# Patient Record
Sex: Male | Born: 2000 | Race: White | Hispanic: No | Marital: Single | Smoking: Never smoker
Health system: Southern US, Community
[De-identification: ages and names within clinical notes are randomized; demographics above are authoritative.]

## PROBLEM LIST (undated history)

## (undated) DIAGNOSIS — S52502D Unspecified fracture of the lower end of left radius, subsequent encounter for closed fracture with routine healing: Secondary | ICD-10-CM

## (undated) DIAGNOSIS — L709 Acne, unspecified: Secondary | ICD-10-CM

## (undated) HISTORY — PX: CIRCUMCISION: SUR203

## (undated) HISTORY — DX: Acne, unspecified: L70.9

## (undated) HISTORY — DX: Unspecified fracture of the lower end of left radius, subsequent encounter for closed fracture with routine healing: S52.502D

---
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
## 2022-10-28 ENCOUNTER — Encounter: Payer: Self-pay | Admitting: Nurse Practitioner

## 2022-10-28 ENCOUNTER — Ambulatory Visit: Payer: BC Managed Care – PPO | Admitting: Nurse Practitioner

## 2022-10-28 ENCOUNTER — Other Ambulatory Visit (HOSPITAL_COMMUNITY)
Admission: RE | Admit: 2022-10-28 | Discharge: 2022-10-28 | Disposition: A | Discharge status: Home / Self Care | Payer: BC Managed Care – PPO | Source: Ambulatory Visit | Attending: Nurse Practitioner | Admitting: Nurse Practitioner

## 2022-10-28 ENCOUNTER — Other Ambulatory Visit: Payer: Self-pay

## 2022-10-28 VITALS — BP 120/80 | HR 85 | Temp 98.4°F | Resp 18 | Ht 69.25 in | Wt 153.0 lb

## 2022-10-28 DIAGNOSIS — Z113 Encounter for screening for infections with a predominantly sexual mode of transmission: Secondary | ICD-10-CM | POA: Diagnosis present

## 2022-10-28 DIAGNOSIS — T38895A Adverse effect of other hormones and synthetic substitutes, initial encounter: Secondary | ICD-10-CM

## 2022-10-28 DIAGNOSIS — Z23 Encounter for immunization: Secondary | ICD-10-CM | POA: Diagnosis not present

## 2022-10-28 DIAGNOSIS — R58 Hemorrhage, not elsewhere classified: Secondary | ICD-10-CM | POA: Diagnosis not present

## 2022-10-28 DIAGNOSIS — Z1159 Encounter for screening for other viral diseases: Secondary | ICD-10-CM

## 2022-10-28 DIAGNOSIS — Z114 Encounter for screening for human immunodeficiency virus [HIV]: Secondary | ICD-10-CM | POA: Diagnosis not present

## 2022-10-28 DIAGNOSIS — K640 First degree hemorrhoids: Secondary | ICD-10-CM | POA: Diagnosis not present

## 2022-10-28 DIAGNOSIS — Z131 Encounter for screening for diabetes mellitus: Secondary | ICD-10-CM

## 2022-10-28 DIAGNOSIS — Z130 Encounter for screening for diseases of the blood and blood-forming organs and certain disorders involving the immune mechanism: Secondary | ICD-10-CM

## 2022-10-28 DIAGNOSIS — Z8379 Family history of other diseases of the digestive system: Secondary | ICD-10-CM

## 2022-10-28 DIAGNOSIS — Z13220 Encounter for screening for lipoid disorders: Secondary | ICD-10-CM

## 2022-10-28 MED ORDER — HYDROCORTISONE ACETATE 25 MG RE SUPP: 25.0000 mg | Freq: Two times a day (BID) | RECTAL | 0 refills | Status: DC

## 2022-10-28 NOTE — Progress Notes (Signed)
BP 120/80   Pulse 85   Temp 98.4 F (36.9 C) (Oral)   Resp 18   Ht 5' 9.25" (1.759 m)   Wt 153 lb (69.4 kg)   SpO2 99%   BMI 22.43 kg/m    Subjective:    Patient ID: Kevin Miranda, male    DOB: Aug 07, 2001, 22 y.o.   MRN: [id number]  HPI: Kevin Miranda is a 22 y.o. male  Chief Complaint  Patient presents with   Establish Care   hormone  Establish care: his last physical was years ago.  Medical history includes was on hormone therapy from planned parent hood but is no longer taking.  Family history includes MI, depression, celiac disease.  Health Maintenance labs.   Blood on his toilet paper:  he reports that he has had blood on his toilet paper off and on for at least a year.  He says he notices the blood on his toilet paper.  He denies any constipation.  He says it sometimes hurts when he has a bowel movement.  Upon inspection noted 1 degree hemorrhoid.  Discussed findings with patient, sent in Anusol suppository. Recommend increasing fiber and water in diet.  Can also take a stool softener.  If no improvement will refer to GI.  Family history of celiac disease: his mother was recently diagnosed with celiac disease and he would like to be checked.   Hormones checked:  he says he would like to have his hormones checked.  He was previously taking estrogen and is no longer taking it.  He says he was transitioning but is no longer and stopped taking the hormones.    Relevant past medical, surgical, family and social history reviewed and updated as indicated. Interim medical history since our last visit reviewed. Allergies and medications reviewed and updated.  Review of Systems  Constitutional: Negative for fever or weight change.  Respiratory: Negative for cough and shortness of breath.   Cardiovascular: Negative for chest pain or palpitations.  Gastrointestinal: Negative for abdominal pain, no bowel changes.  Musculoskeletal: Negative for gait problem or joint swelling.   Skin: Negative for rash.  Neurological: Negative for dizziness or headache.  No other specific complaints in a complete review of systems (except as listed in HPI above).      Objective:    BP 120/80   Pulse 85   Temp 98.4 F (36.9 C) (Oral)   Resp 18   Ht 5' 9.25" (1.759 m)   Wt 153 lb (69.4 kg)   SpO2 99%   BMI 22.43 kg/m   Wt Readings from Last 3 Encounters:  10/28/22 153 lb (69.4 kg)  04/03/17 138 lb (62.6 kg) (41 %, Z= -0.22)*  02/08/16 136 lb 8 oz (61.9 kg) (54 %, Z= 0.11)*   * Growth percentiles are based on CDC (Boys, 2-20 Years) data.    Physical Exam  Constitutional: Patient appears well-developed and well-nourished.  No distress.  HEENT: head atraumatic, normocephalic, pupils equal and reactive to light, neck supple Cardiovascular: Normal rate, regular rhythm and normal heart sounds.  No murmur heard. No BLE edema. Pulmonary/Chest: Effort normal and breath sounds normal. No respiratory distress. Abdominal: Soft.  There is no tenderness. Anus:  1 degree hemorrhoid  Psychiatric: Patient has a normal mood and affect. behavior is normal. Judgment and thought content normal.     Assessment & Plan:   Problem List Items Addressed This Visit   None Visit Diagnoses     Grade I  hemorrhoids    -  Primary   Anusol suppository. Recommend increasing fiber and water in diet.  Can also take a stool softener.  If no improvement will refer to GI.   Relevant Medications   hydrocortisone (ANUSOL-HC) 25 MG suppository   Blood on toilet paper       homorrhoid noted on exam   Relevant Orders   CBC with Differential/Platelet   Need for influenza vaccination       Relevant Orders   Flu Vaccine QUAD 6+ mos PF IM (Fluarix Quad PF) (Completed)   Encounter for hepatitis C screening test for low risk patient       Relevant Orders   Hepatitis C antibody   Screening for HIV without presence of risk factors       Relevant Orders   HIV Antibody (routine testing w rflx)   Screening  for cholesterol level       Relevant Orders   Lipid panel   Screening for diabetes mellitus       Relevant Orders   COMPLETE METABOLIC PANEL WITH GFR   Hemoglobin A1c   Screening for deficiency anemia       Relevant Orders   CBC with Differential/Platelet   Family history of celiac disease       Relevant Orders   Celiac Disease Panel   Adverse effect of sex hormones       patient was previously on estrogen for transitioning,  he is no longer taking it and would like to check his hormone levels.   Relevant Orders   Estrogens, total   Testosterone   TSH   Screening for STD (sexually transmitted disease)       Relevant Orders   Urine cytology ancillary only   RPR        Follow up plan: Return in about 6 months (around 04/28/2023) for cpe.

## 2022-10-30 LAB — URINE CYTOLOGY ANCILLARY ONLY
Chlamydia: NEGATIVE
Comment: NEGATIVE
Comment: NORMAL
Neisseria Gonorrhea: NEGATIVE

## 2022-10-31 LAB — RPR: RPR Ser Ql: NONREACTIVE

## 2022-10-31 LAB — COMPLETE METABOLIC PANEL WITH GFR
AG Ratio: 1.8 (calc) (ref 1.0–2.5)
ALT: 12 U/L (ref 9–46)
AST: 15 U/L (ref 10–40)
Albumin: 4.8 g/dL (ref 3.6–5.1)
Alkaline phosphatase (APISO): 88 U/L (ref 36–130)
BUN: 17 mg/dL (ref 7–25)
CO2: 29 mmol/L (ref 20–32)
Calcium: 9.6 mg/dL (ref 8.6–10.3)
Chloride: 104 mmol/L (ref 98–110)
Creat: 0.81 mg/dL (ref 0.60–1.24)
Globulin: 2.6 g/dL (calc) (ref 1.9–3.7)
Glucose, Bld: 104 mg/dL — ABNORMAL HIGH (ref 65–99)
Potassium: 4.6 mmol/L (ref 3.5–5.3)
Sodium: 140 mmol/L (ref 135–146)
Total Bilirubin: 3 mg/dL — ABNORMAL HIGH (ref 0.2–1.2)
Total Protein: 7.4 g/dL (ref 6.1–8.1)
eGFR: 128 mL/min/{1.73_m2} (ref >=60)

## 2022-10-31 LAB — CELIAC DISEASE PANEL
(tTG) Ab, IgA: <1 U/mL
(tTG) Ab, IgG: <1 U/mL
Gliadin IgA: <1 U/mL
Gliadin IgG: <1 U/mL
Immunoglobulin A: 129 mg/dL (ref 47–310)

## 2022-10-31 LAB — HEPATITIS C ANTIBODY: Hepatitis C Ab: NONREACTIVE

## 2022-10-31 LAB — CBC WITH DIFFERENTIAL/PLATELET
Absolute Monocytes: 458 cells/uL (ref 200–950)
Basophils Absolute: 32 cells/uL (ref 0–200)
Basophils Relative: 0.4 %
Eosinophils Absolute: 119 cells/uL (ref 15–500)
Eosinophils Relative: 1.5 %
HCT: 46.3 % (ref 38.5–50.0)
Hemoglobin: 16.3 g/dL (ref 13.2–17.1)
Lymphs Abs: 1509 cells/uL (ref 850–3900)
MCH: 31.5 pg (ref 27.0–33.0)
MCHC: 35.2 g/dL (ref 32.0–36.0)
MCV: 89.6 fL (ref 80.0–100.0)
MPV: 10.8 fL (ref 7.5–12.5)
Monocytes Relative: 5.8 %
Neutro Abs: 5783 cells/uL (ref 1500–7800)
Neutrophils Relative %: 73.2 %
Platelets: 231 10*3/uL (ref 140–400)
RBC: 5.17 10*6/uL (ref 4.20–5.80)
RDW: 12.5 % (ref 11.0–15.0)
Total Lymphocyte: 19.1 %
WBC: 7.9 10*3/uL (ref 3.8–10.8)

## 2022-10-31 LAB — HIV ANTIBODY (ROUTINE TESTING W REFLEX): HIV 1&2 Ab, 4th Generation: NONREACTIVE

## 2022-10-31 LAB — LIPID PANEL
Cholesterol: 124 mg/dL (ref <200)
HDL: 56 mg/dL (ref >=40)
LDL Cholesterol (Calc): 54 mg/dL (calc)
Non-HDL Cholesterol (Calc): 68 mg/dL (calc) (ref <130)
Total CHOL/HDL Ratio: 2.2 (calc) (ref <5.0)
Triglycerides: 61 mg/dL (ref <150)

## 2022-10-31 LAB — HEMOGLOBIN A1C
Hgb A1c MFr Bld: 4.8 % of total Hgb (ref <5.7)
Mean Plasma Glucose: 91 mg/dL
eAG (mmol/L): 5 mmol/L

## 2022-10-31 LAB — TESTOSTERONE: Testosterone: 899 ng/dL — ABNORMAL HIGH (ref 250–827)

## 2022-10-31 LAB — TSH: TSH: 0.52 mIU/L (ref 0.40–4.50)

## 2022-10-31 LAB — ESTROGENS, TOTAL: Estrogen: 364 pg/mL (ref <=404)

## 2023-04-29 NOTE — Progress Notes (Signed)
Name: Kevin Miranda   MRN: [id number]    DOB: [birth year]   Date:05/01/2023       Progress Note  Subjective  Chief Complaint  Chief Complaint  Patient presents with   Annual Exam    HPI  Patient presents for annual CPE .  IPSS Questionnaire (AUA-7): Over the past month.   1)  How often have you had a sensation of not emptying your bladder completely after you finish urinating?  0 - Not at all  2)  How often have you had to urinate again less than two hours after you finished urinating? 0 - Not at all  3)  How often have you found you stopped and started again several times when you urinated?  0 - Not at all  4) How difficult have you found it to postpone urination?  0 - Not at all  5) How often have you had a weak urinary stream?  0 - Not at all  6) How often have you had to push or strain to begin urination?  0 - Not at all  7) How many times did you most typically get up to urinate from the time you went to bed until the time you got up in the morning?  0 - None  Total score:  0-7 mildly symptomatic   8-19 moderately symptomatic   20-35 severely symptomatic     Diet: Regular, he tries to eat well balanced  Exercise: 1 day a week 40 minutes Last Dental Exam: February 2024 Last Eye Exam: 2020  Depression: phq 9 is negative    05/01/2023    2:42 PM 05/01/2023    2:37 PM 10/28/2022    3:15 PM 02/08/2016    3:19 PM  Depression screen PHQ 2/9  Decreased Interest 0 0 0 0  Down, Depressed, Hopeless 0 0 0 0  PHQ - 2 Score 0 0 0 0  Altered sleeping  0    Tired, decreased energy  0    Change in appetite  0    Feeling bad or failure about yourself   0    Trouble concentrating  0    Moving slowly or fidgety/restless  0    Suicidal thoughts  0    PHQ-9 Score  0    Difficult doing work/chores  Not difficult at all      Hypertension:  BP Readings from Last 3 Encounters:  05/01/23 122/78  10/28/22 120/80  04/03/17 104/66 (12%, Z = -1.17 /  43%, Z = -0.18)*   *BP percentiles  are based on the 2017 AAP Clinical Practice Guideline for boys    Obesity: Wt Readings from Last 3 Encounters:  05/01/23 151 lb 6.4 oz (68.7 kg)  10/28/22 153 lb (69.4 kg)  04/03/17 138 lb (62.6 kg) (41%, Z= -0.22)*   * Growth percentiles are based on CDC (Boys, 2-20 Years) data.   BMI Readings from Last 3 Encounters:  05/01/23 22.04 kg/m  10/28/22 22.43 kg/m  04/03/17 20.38 kg/m (37%, Z= -0.34)*   * Growth percentiles are based on CDC (Boys, 2-20 Years) data.     Lipids:  Lab Results  Component Value Date   CHOL 124 10/28/2022   Lab Results  Component Value Date   HDL 56 10/28/2022   Lab Results  Component Value Date   LDLCALC 54 10/28/2022   Lab Results  Component Value Date   TRIG 61 10/28/2022   Lab Results  Component Value Date   CHOLHDL  2.2 10/28/2022   No results found for: "LDLDIRECT" Glucose:  Glucose, Bld  Date Value Ref Range Status  10/28/2022 104 (H) 65 - 99 mg/dL Final    Comment:    .            Fasting reference interval . For someone without known diabetes, a glucose value between 100 and 125 mg/dL is consistent with prediabetes and should be confirmed with a follow-up test. .     Flowsheet Row Office Visit from 05/01/2023 in [facility]  AUDIT-C Score 0       Single STD testing and prevention (HIV/chl/gon/syphilis):  yes 10/28/2022 Sexual history: yes Hep C Screening: 10/28/2022 Skin cancer: Discussed monitoring for atypical lesions Colorectal cancer: NA, no family history Prostate cancer:  no No results found for: "PSA"   Lung cancer:  Low Dose CT Chest recommended if Age 18-80 years, 30 pack-year currently smoking OR have quit w/in 15years. Patient  no a candidate for screening   AAA: The USPSTF recommends one-time screening with ultrasonography in men ages 39 to 75 years who have ever smoked. Patient   no, a candidate for screening  ECG:    Vaccines:  HPV: up to at age 3 , ask insurance if  age between 87-45  Shingrix: 73-64 yo and ask insurance if covered when patient above 76 yo Pneumonia:  educated and discussed with patient. Flu:  educated and discussed with patient.    Advanced Care Planning: A voluntary discussion about advance care planning including the explanation and discussion of advance directives.  Discussed health care proxy and Living will, and the patient was able to identify a health care proxy as father.  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   Systolic ejection murmur 02/08/2016   Abnormal hearing test 02/08/2016    Past Surgical History:  Procedure Laterality Date   CIRCUMCISION      Family History  Problem Relation Age of Onset   Depression Mother    Asthma Father    Migraines Father    Allergic rhinitis Brother     Social History   Socioeconomic History   Marital status: Single    Spouse name: Not on file   Number of children: Not on file   Years of education: Not on file   Highest education level: Not on file  Occupational History   Not on file  Tobacco Use   Smoking status: Never    Passive exposure: Never   Smokeless tobacco: Never  Vaping Use   Vaping status: Never Used  Substance and Sexual Activity   Alcohol use: No    Alcohol/week: 0.0 standard drinks of alcohol   Drug use: No   Sexual activity: Not Currently  Other Topics Concern   Not on file  Social History Narrative   Not on file   Social Determinants of Health   Financial Resource Strain: Low Risk  (05/01/2023)   Overall Financial Resource Strain (CARDIA)    Difficulty of Paying Living Expenses: Not hard at all  Food Insecurity: No Food Insecurity (05/01/2023)   Hunger Vital Sign    Worried About Running Out of Food in the Last Year: Never true    Ran Out of Food in the Last Year: Never true  Transportation Needs: No Transportation Needs  (05/01/2023)   PRAPARE - Transportation  Lack of Transportation (Medical): No    Lack of Transportation (Non-Medical): No  Physical Activity: Insufficiently Active (05/01/2023)   Exercise Vital Sign    Days of Exercise per Week: 1 day    Minutes of Exercise per Session: 40 min  Stress: No Stress Concern Present (05/01/2023)   [employer] of Occupational Health - Occupational Stress Questionnaire    Feeling of Stress : Only a little  Social Connections: Socially Isolated (05/01/2023)   Social Connection and Isolation Panel [NHANES]    Frequency of Communication with Friends and Family: More than three times a week    Frequency of Social Gatherings with Friends and Family: More than three times a week    Attends Religious Services: Never    Database administrator or Organizations: No    Attends Banker Meetings: Never    Marital Status: Never married  Intimate Partner Violence: Not At Risk (05/01/2023)   Humiliation, Afraid, Rape, and Kick questionnaire    Fear of Current or Ex-Partner: No    Emotionally Abused: No    Physically Abused: No    Sexually Abused: No     Current Outpatient Medications:    hydrocortisone (ANUSOL-HC) 25 MG suppository, Place 1 suppository (25 mg total) rectally 2 (two) times daily., Disp: 12 suppository, Rfl: 0   isotretinoin (ACCUTANE) 10 MG capsule, Take 10 mg by mouth 2 (two) times daily., Disp: , Rfl:   Allergies  Allergen Reactions   Penicillins Rash     ROS  Constitutional: Negative for fever or weight change.  Respiratory: Negative for cough and shortness of breath.   Cardiovascular: Negative for chest pain or palpitations.  Gastrointestinal: Negative for abdominal pain, no bowel changes.  Musculoskeletal: Negative for gait problem or joint swelling.  Skin: Negative for rash.  Neurological: Negative for dizziness or headache.  No other specific complaints in a complete review of systems (except as listed in HPI above).     Objective  Vitals:   05/01/23 1439  BP: 122/78  Pulse: 85  Resp: 18  Temp: 97.8 F (36.6 C)  TempSrc: Oral  SpO2: 99%  Weight: 151 lb 6.4 oz (68.7 kg)  Height: 5' 9.5" (1.765 m)    Body mass index is 22.04 kg/m.  Physical Exam Constitutional: Patient appears well-developed and well-nourished. No distress.  HENT: Head: Normocephalic and atraumatic. Ears: B TMs ok, no erythema or effusion; Nose: Nose normal. Mouth/Throat: Oropharynx is clear and moist. No oropharyngeal exudate.  Eyes: Conjunctivae and EOM are normal. Pupils are equal, round, and reactive to light. No scleral icterus.  Neck: Normal range of motion. Neck supple. No JVD present. No thyromegaly present.  Cardiovascular: Normal rate, regular rhythm and normal heart sounds.  No murmur heard. No BLE edema. Pulmonary/Chest: Effort normal and breath sounds normal. No respiratory distress. Abdominal: Soft. Bowel sounds are normal, no distension. There is no tenderness. no masses Musculoskeletal: Normal range of motion, no joint effusions. No gross deformities Neurological: he is alert and oriented to person, place, and time. No cranial nerve deficit. Coordination, balance, strength, speech and gait are normal.  Skin: Skin is warm and dry. No rash noted. No erythema.  Psychiatric: Patient has a normal mood and affect. behavior is normal. Judgment and thought content normal.   No results found for this or any previous visit (from the past 2160 hour(s)).   Fall Risk:    05/01/2023    2:37 PM 10/28/2022    3:15 PM 02/08/2016  3:19 PM  Fall Risk   Falls in the past year? 0 0 No  Number falls in past yr: 0 0   Injury with Fall? 0 0   Risk for fall due to : No Fall Risks    Follow up Falls prevention discussed;Education provided;Falls evaluation completed Falls evaluation completed      Functional Status Survey: Is the patient deaf or have difficulty hearing?: No Does the patient have difficulty seeing, even when  wearing glasses/contacts?: No Does the patient have difficulty concentrating, remembering, or making decisions?: No Does the patient have difficulty walking or climbing stairs?: No Does the patient have difficulty dressing or bathing?: No Does the patient have difficulty doing errands alone such as visiting a doctor's office or shopping?: No    Assessment & Plan  1. Annual physical exam Eat well balanced diet Increase physical activity to 150 min a week Labs recently done  2. Need for Tdap vaccination  - Tdap vaccine greater than or equal to 7yo IM    -Prostate cancer screening and PSA options (with potential risks and benefits of testing vs not testing) were discussed along with recent recs/guidelines. -USPSTF grade A and B recommendations reviewed with patient; age-appropriate recommendations, preventive care, screening tests, etc discussed and encouraged; healthy living encouraged; see AVS for patient education given to patient -Discussed importance of 150 minutes of physical activity weekly, eat two servings of fish weekly, eat one serving of tree nuts ( cashews, pistachios, pecans, almonds.Marland Kitchen) every other day, eat 6 servings of fruit/vegetables daily and drink plenty of water and avoid sweet beverages.  -Reviewed Health Maintenance: yes

## 2023-05-01 ENCOUNTER — Encounter: Payer: Self-pay | Admitting: Nurse Practitioner

## 2023-05-01 ENCOUNTER — Ambulatory Visit (INDEPENDENT_AMBULATORY_CARE_PROVIDER_SITE_OTHER): Payer: BC Managed Care – PPO | Admitting: Nurse Practitioner

## 2023-05-01 ENCOUNTER — Other Ambulatory Visit: Payer: Self-pay

## 2023-05-01 VITALS — BP 122/78 | HR 85 | Temp 97.8°F | Resp 18 | Ht 69.5 in | Wt 151.4 lb

## 2023-05-01 DIAGNOSIS — Z0000 Encounter for general adult medical examination without abnormal findings: Secondary | ICD-10-CM

## 2023-05-01 DIAGNOSIS — Z23 Encounter for immunization: Secondary | ICD-10-CM | POA: Diagnosis not present

## 2024-05-04 ENCOUNTER — Encounter: Payer: Medicaid Other | Admitting: Nurse Practitioner

## 2024-05-24 ENCOUNTER — Ambulatory Visit (INDEPENDENT_AMBULATORY_CARE_PROVIDER_SITE_OTHER): Admitting: Nurse Practitioner

## 2024-05-24 ENCOUNTER — Encounter: Payer: Self-pay | Admitting: Nurse Practitioner

## 2024-05-24 VITALS — BP 116/72 | HR 107 | Temp 97.5°F | Resp 14 | Ht 69.5 in | Wt 147.7 lb

## 2024-05-24 DIAGNOSIS — Z130 Encounter for screening for diseases of the blood and blood-forming organs and certain disorders involving the immune mechanism: Secondary | ICD-10-CM | POA: Diagnosis not present

## 2024-05-24 DIAGNOSIS — Z131 Encounter for screening for diabetes mellitus: Secondary | ICD-10-CM

## 2024-05-24 DIAGNOSIS — Z13220 Encounter for screening for lipoid disorders: Secondary | ICD-10-CM | POA: Diagnosis not present

## 2024-05-24 DIAGNOSIS — Z0000 Encounter for general adult medical examination without abnormal findings: Secondary | ICD-10-CM | POA: Diagnosis not present

## 2024-05-24 LAB — LIPID PANEL
Cholesterol: 129 mg/dL (ref <200)
HDL: 58 mg/dL (ref >=40)
LDL Cholesterol (Calc): 57 mg/dL
Non-HDL Cholesterol (Calc): 71 mg/dL (ref <130)
Total CHOL/HDL Ratio: 2.2 (calc) (ref <5.0)
Triglycerides: 64 mg/dL (ref <150)

## 2024-05-24 LAB — CBC WITH DIFFERENTIAL/PLATELET
Absolute Lymphocytes: 1672 {cells}/uL (ref 850–3900)
Absolute Monocytes: 413 {cells}/uL (ref 200–950)
Basophils Absolute: 28 {cells}/uL (ref 0–200)
Basophils Relative: 0.5 %
Eosinophils Absolute: 99 {cells}/uL (ref 15–500)
Eosinophils Relative: 1.8 %
HCT: 47.7 % (ref 38.5–50.0)
Hemoglobin: 16.5 g/dL (ref 13.2–17.1)
MCH: 32 pg (ref 27.0–33.0)
MCHC: 34.6 g/dL (ref 32.0–36.0)
MCV: 92.4 fL (ref 80.0–100.0)
MPV: 11.2 fL (ref 7.5–12.5)
Monocytes Relative: 7.5 %
Neutro Abs: 3289 {cells}/uL (ref 1500–7800)
Neutrophils Relative %: 59.8 %
Platelets: 192 Thousand/uL (ref 140–400)
RBC: 5.16 Million/uL (ref 4.20–5.80)
RDW: 12.3 % (ref 11.0–15.0)
Total Lymphocyte: 30.4 %
WBC: 5.5 Thousand/uL (ref 3.8–10.8)

## 2024-05-24 LAB — COMPREHENSIVE METABOLIC PANEL WITH GFR
AG Ratio: 2.2 (calc) (ref 1.0–2.5)
ALT: 12 U/L (ref 9–46)
AST: 15 U/L (ref 10–40)
Albumin: 4.9 g/dL (ref 3.6–5.1)
Alkaline phosphatase (APISO): 63 U/L (ref 36–130)
BUN: 11 mg/dL (ref 7–25)
CO2: 30 mmol/L (ref 20–32)
Calcium: 9.6 mg/dL (ref 8.6–10.3)
Chloride: 102 mmol/L (ref 98–110)
Creat: 0.83 mg/dL (ref 0.60–1.24)
Globulin: 2.2 g/dL (ref 1.9–3.7)
Glucose, Bld: 89 mg/dL (ref 65–99)
Potassium: 4.3 mmol/L (ref 3.5–5.3)
Sodium: 139 mmol/L (ref 135–146)
Total Bilirubin: 5.4 mg/dL — ABNORMAL HIGH (ref 0.2–1.2)
Total Protein: 7.1 g/dL (ref 6.1–8.1)
eGFR: 125 mL/min/1.73m2 (ref >=60)

## 2024-05-24 LAB — HEMOGLOBIN A1C
Hgb A1c MFr Bld: 4.7 % (ref <5.7)
Mean Plasma Glucose: 88 mg/dL
eAG (mmol/L): 4.9 mmol/L

## 2024-05-24 NOTE — Progress Notes (Signed)
 Name: Kevin Miranda   MRN: [id number]    DOB: 31-May-2001   Date:05/24/2024       Progress Note  Subjective  Chief Complaint  Chief Complaint  Patient presents with   Annual Exam    HPI  Patient presents for annual CPE . Discussed the use of AI scribe software for clinical note transcription with the patient, who gave verbal consent to proceed.  History of Present Illness Kevin Miranda is a 23 year old male who presents for an annual physical exam.  Diet and exercise habits - Diet has worsened since the last visit, though not severely - Exercise routine has increased since the last visit; exact amount unspecified  Sleep patterns - Average sleep duration is approximately five hours per night, ranging from four to seven hours - Sleep duration is below the recommended eight hours  Dental health - Wisdom teeth removed earlier this year - Multiple dental visits related to wisdom teeth removal  Vision - No visual disturbances - Last vision check-up showed 20/20 vision - Does not recall the last eye doctor visit  Genitourinary and gastrointestinal symptoms - No problems with urination - No problems with bowel movements      Diet: tries to eat well balanced but recognizes room for improvement Exercise: is not consistent, recommend 150 min of physical activity weekly   Sleep: 4-7 hours Last dental exam:this year Last eye exam: unsure,  no issues with his eyes  Depression: phq 9 is negative    05/24/2024   10:46 AM 05/01/2023    2:42 PM 05/01/2023    2:37 PM 10/28/2022    3:15 PM 02/08/2016    3:19 PM  Depression screen PHQ 2/9  Decreased Interest 0 0 0 0 0  Down, Depressed, Hopeless 0 0 0 0 0  PHQ - 2 Score 0 0 0 0 0  Altered sleeping 0  0    Tired, decreased energy 0  0    Change in appetite 0  0    Feeling bad or failure about yourself  0  0    Trouble concentrating 0  0    Moving slowly or fidgety/restless 0  0    Suicidal thoughts 0  0    PHQ-9 Score 0  0     Difficult doing work/chores Not difficult at all  Not difficult at all      Hypertension:  BP Readings from Last 3 Encounters:  05/24/24 116/72  05/01/23 122/78  10/28/22 120/80    Obesity: Wt Readings from Last 3 Encounters:  05/24/24 147 lb 11.2 oz (67 kg)  05/01/23 151 lb 6.4 oz (68.7 kg)  10/28/22 153 lb (69.4 kg)   BMI Readings from Last 3 Encounters:  05/24/24 21.50 kg/m  05/01/23 22.04 kg/m  10/28/22 22.43 kg/m     Lipids:  Lab Results  Component Value Date   CHOL 124 10/28/2022   Lab Results  Component Value Date   HDL 56 10/28/2022   Lab Results  Component Value Date   LDLCALC 54 10/28/2022   Lab Results  Component Value Date   TRIG 61 10/28/2022   Lab Results  Component Value Date   CHOLHDL 2.2 10/28/2022   No results found for: LDLDIRECT Glucose:  Glucose, Bld  Date Value Ref Range Status  10/28/2022 104 (H) 65 - 99 mg/dL Final    Comment:    .            Fasting reference interval .  For someone without known diabetes, a glucose value between 100 and 125 mg/dL is consistent with prediabetes and should be confirmed with a follow-up test. .     Flowsheet Row Office Visit from 05/24/2024 in [facility]  AUDIT-C Score 0     Single STD testing and prevention (HIV/chl/gon/syphilis): completed Hep C: completed  Skin cancer: Discussed monitoring for atypical lesions Colorectal cancer: does not qualify Prostate cancer: does not qualify No results found for: PSA   Lung cancer:   Low Dose CT Chest recommended if Age 81-80 years, 30 pack-year currently smoking OR have quit w/in 15years. Patient does not qualify.   AAA:  The USPSTF recommends one-time screening with ultrasonography in men ages 78 to 73 years who have ever smoked ECG:  none  Vaccines:  HPV: up to at age 70 , ask insurance if age between 51-45  Shingrix: 8-64 yo and ask insurance if covered when patient above 83 yo Pneumonia:  educated  and discussed with patient. Flu:  educated and discussed with patient.  Advanced Care Planning: A voluntary discussion about advance care planning including the explanation and discussion of advance directives.  Discussed health care proxy and Living will, and the patient was able to identify a health care proxy as Father.  Patient does not have a living will at present time. If patient does have living will, I have requested they bring this to the clinic to be scanned in to their chart.  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   Systolic ejection murmur 02/08/2016   Abnormal hearing test 02/08/2016    Past Surgical History:  Procedure Laterality Date   CIRCUMCISION      Family History  Problem Relation Age of Onset   Depression Mother    Asthma Father    Migraines Father    Allergic rhinitis Brother     Social History   Socioeconomic History   Marital status: Single    Spouse name: Not on file   Number of children: Not on file   Years of education: Not on file   Highest education level: Not on file  Occupational History   Not on file  Tobacco Use   Smoking status: Never    Passive exposure: Never   Smokeless tobacco: Never  Vaping Use   Vaping status: Never Used  Substance and Sexual Activity   Alcohol use: No    Alcohol/week: 0.0 standard drinks of alcohol   Drug use: No   Sexual activity: Not Currently  Other Topics Concern   Not on file  Social History Narrative   Not on file   Social Drivers of Health   Financial Resource Strain: Low Risk  (05/24/2024)   Overall Financial Resource Strain (CARDIA)    Difficulty of Paying Living Expenses: Not hard at all  Food Insecurity: No Food Insecurity (05/24/2024)   Hunger Vital Sign    Worried About Running Out of Food in the Last Year: Never true    Ran Out of Food in the Last Year: Never true  Transportation Needs: No Transportation Needs (05/24/2024)   PRAPARE -  Administrator, Civil Service (Medical): No    Lack of Transportation (Non-Medical): No  Physical Activity: Inactive (05/24/2024)   Exercise Vital Sign    Days of Exercise per Week: 0 days    Minutes of Exercise per Session: 0 min  Stress: No Stress Concern Present (05/24/2024)  [employer] of Occupational Health - Occupational Stress Questionnaire    Feeling of Stress: Not at all  Social Connections: Socially Isolated (05/24/2024)   Social Connection and Isolation Panel    Frequency of Communication with Friends and Family: Twice a week    Frequency of Social Gatherings with Friends and Family: Once a week    Attends Religious Services: Never    Database administrator or Organizations: No    Attends Banker Meetings: Never    Marital Status: Never married  Intimate Partner Violence: Not At Risk (05/24/2024)   Humiliation, Afraid, Rape, and Kick questionnaire    Fear of Current or Ex-Partner: No    Emotionally Abused: No    Physically Abused: No    Sexually Abused: No     Current Outpatient Medications:    hydrocortisone (ANUSOL-HC) 25 MG suppository, Place 1 suppository (25 mg total) rectally 2 (two) times daily. (Patient not taking: Reported on 05/24/2024), Disp: 12 suppository, Rfl: 0   isotretinoin (ACCUTANE) 10 MG capsule, Take 10 mg by mouth 2 (two) times daily. (Patient not taking: Reported on 05/24/2024), Disp: , Rfl:   Allergies  Allergen Reactions   Penicillins Rash     ROS  Constitutional: Negative for fever or weight change.  Respiratory: Negative for cough and shortness of breath.   Cardiovascular: Negative for chest pain or palpitations.  Gastrointestinal: Negative for abdominal pain, no bowel changes.  Musculoskeletal: Negative for gait problem or joint swelling.  Skin: Negative for rash.  Neurological: Negative for dizziness or headache.  No other specific complaints in a complete review of systems (except as listed in HPI above).     Objective  Vitals:   05/24/24 1043  BP: 116/72  Pulse: (!) 107  Resp: 14  Temp: (!) 97.5 F (36.4 C)  SpO2: 99%  Weight: 147 lb 11.2 oz (67 kg)  Height: 5' 9.5 (1.765 m)    Body mass index is 21.5 kg/m.  Physical Exam Vitals reviewed.  Constitutional:      Appearance: Normal appearance.  HENT:     Head: Normocephalic.     Right Ear: Tympanic membrane normal.     Left Ear: Tympanic membrane normal.     Nose: Nose normal.  Eyes:     Extraocular Movements: Extraocular movements intact.     Conjunctiva/sclera: Conjunctivae normal.     Pupils: Pupils are equal, round, and reactive to light.  Neck:     Thyroid: No thyroid mass, thyromegaly or thyroid tenderness.  Cardiovascular:     Rate and Rhythm: Normal rate and regular rhythm.     Pulses: Normal pulses.     Heart sounds: Normal heart sounds.  Pulmonary:     Effort: Pulmonary effort is normal.     Breath sounds: Normal breath sounds.  Abdominal:     General: Bowel sounds are normal.     Palpations: Abdomen is soft.  Musculoskeletal:        General: Normal range of motion.     Cervical back: Normal range of motion and neck supple.     Right lower leg: No edema.     Left lower leg: No edema.  Skin:    General: Skin is warm and dry.     Capillary Refill: Capillary refill takes less than 2 seconds.  Neurological:     General: No focal deficit present.     Mental Status: He is alert and oriented to person, place, and time. Mental status is at  baseline.  Psychiatric:        Mood and Affect: Mood normal.        Behavior: Behavior normal.        Thought Content: Thought content normal.        Judgment: Judgment normal.      No results found for this or any previous visit (from the past 2160 hours).   Fall Risk:    05/24/2024   10:39 AM 05/01/2023    2:37 PM 10/28/2022    3:15 PM 02/08/2016    3:19 PM  Fall Risk   Falls in the past year? 0 0 0 No   Number falls in past yr: 0 0 0   Injury with Fall? 0  0 0   Risk for fall due to :  No Fall Risks    Follow up Falls evaluation completed Falls prevention discussed;Education provided;Falls evaluation completed Falls evaluation completed       Data saved with a previous flowsheet row definition      Functional Status Survey: Is the patient deaf or have difficulty hearing?: No Does the patient have difficulty seeing, even when wearing glasses/contacts?: No Does the patient have difficulty concentrating, remembering, or making decisions?: No Does the patient have difficulty walking or climbing stairs?: No Does the patient have difficulty dressing or bathing?: No Does the patient have difficulty doing errands alone such as visiting a doctor's office or shopping?: No    Assessment & Plan  Problem List Items Addressed This Visit   None Visit Diagnoses       Annual physical exam    -  Primary     Screening for deficiency anemia         Screening for cholesterol level         Screening for diabetes mellitus          Assessment and Plan Assessment & Plan Adult Wellness Visit Routine adult wellness visit. Reports decline in diet and exercise since last visit. Sleep averages 4-7 hours per night, below recommended 8 hours. Recent dental visits for wisdom teeth removal. No recent eye exam but no vision problems reported. Blood pressure is normal, and weight has decreased since last visit. No new health concerns reported. Physical exam is unremarkable. - Order blood work to ensure all parameters are within normal limits. - Encourage 150 minutes of physical activity per week for cardiovascular health. - Confirm healthcare proxy remains his father.     -Prostate cancer screening and PSA options (with potential risks and benefits of testing vs not testing) were discussed along with recent recs/guidelines. -USPSTF grade A and B recommendations reviewed with patient; age-appropriate recommendations, preventive care, screening tests, etc  discussed and encouraged; healthy living encouraged; see AVS for patient education given to patient -Discussed importance of 150 minutes of physical activity weekly, eat two servings of fish weekly, eat one serving of tree nuts ( cashews, pistachios, pecans, almonds.SABRA) every other day, eat 6 servings of fruit/vegetables daily and drink plenty of water and avoid sweet beverages.  -Reviewed Health Maintenance: yes

## 2024-05-25 ENCOUNTER — Ambulatory Visit: Payer: Self-pay | Admitting: Nurse Practitioner

## 2024-05-25 ENCOUNTER — Other Ambulatory Visit: Payer: Self-pay | Admitting: Nurse Practitioner

## 2024-05-25 DIAGNOSIS — R17 Unspecified jaundice: Secondary | ICD-10-CM

## 2024-06-15 ENCOUNTER — Ambulatory Visit (INDEPENDENT_AMBULATORY_CARE_PROVIDER_SITE_OTHER): Admitting: Nurse Practitioner

## 2024-06-15 ENCOUNTER — Encounter: Payer: Self-pay | Admitting: Nurse Practitioner

## 2024-06-15 VITALS — BP 112/62 | HR 91 | Temp 97.6°F | Resp 18 | Ht 69.5 in | Wt 144.6 lb

## 2024-06-15 DIAGNOSIS — R208 Other disturbances of skin sensation: Secondary | ICD-10-CM | POA: Diagnosis not present

## 2024-06-15 DIAGNOSIS — R202 Paresthesia of skin: Secondary | ICD-10-CM | POA: Diagnosis not present

## 2024-06-15 NOTE — Progress Notes (Signed)
 BP 112/62   Pulse 91   Temp 97.6 F (36.4 C)   Resp 18   Ht 5' 9.5 (1.765 m)   Wt 144 lb 9.6 oz (65.6 kg)   SpO2 99%   BMI 21.05 kg/m    Subjective:    Patient ID: Kevin Miranda, male    DOB: [birth year], 23 y.o.   MRN: [id number]  HPI: Kevin Miranda is a 23 y.o. male presenting today with 5 day history of intermittent pins and needles sensation to skin. Patient reports the pain can be initiated from a blanket touching him, his hair rubbing against his neck, or even touching a plastic bag.  Patient reports the sensation will be mild in the morning and then worsens as the day goes on and something touches his skin. Patient states the sensation is much worse at night. He describes the sensation as uncomfortable. Patient denies any recent injuries, new medications, or recreational drugs. Patient has not recently been sick and has not had a fever. He denies any changes in his diet and does not reports being on prescription or over-the-counter medications.         05/24/2024   10:46 AM 05/01/2023    2:42 PM 05/01/2023    2:37 PM  Depression screen PHQ 2/9  Decreased Interest 0 0 0  Down, Depressed, Hopeless 0 0 0  PHQ - 2 Score 0 0 0  Altered sleeping 0  0  Tired, decreased energy 0  0  Change in appetite 0  0  Feeling bad or failure about yourself  0  0  Trouble concentrating 0  0  Moving slowly or fidgety/restless 0  0  Suicidal thoughts 0  0  PHQ-9 Score 0  0  Difficult doing work/chores Not difficult at all  Not difficult at all    Relevant past medical, surgical, family and social history reviewed and updated as indicated. Interim medical history since our last visit reviewed. Allergies and medications reviewed and updated.  Review of Systems  Constitutional: Negative for fever or weight change.  Respiratory: Negative for cough and shortness of breath.   Cardiovascular: Negative for chest pain or palpitations.  Gastrointestinal: Negative for abdominal pain, no  bowel changes.  Musculoskeletal: Negative for gait problem or joint swelling.  Skin: Negative for rash.  Neurological: Reports tingling to skin. Negative for dizziness or headache.  No other specific complaints in a complete review of systems (except as listed in HPI above).      Objective:     BP 112/62   Pulse 91   Temp 97.6 F (36.4 C)   Resp 18   Ht 5' 9.5 (1.765 m)   Wt 144 lb 9.6 oz (65.6 kg)   SpO2 99%   BMI 21.05 kg/m    Wt Readings from Last 3 Encounters:  06/15/24 144 lb 9.6 oz (65.6 kg)  05/24/24 147 lb 11.2 oz (67 kg)  05/01/23 151 lb 6.4 oz (68.7 kg)    Physical Exam Constitutional:      Appearance: Normal appearance.  HENT:     Head: Normocephalic and atraumatic.  Cardiovascular:     Rate and Rhythm: Normal rate and regular rhythm.     Pulses: Normal pulses.     Heart sounds: Normal heart sounds.  Pulmonary:     Effort: Pulmonary effort is normal.     Breath sounds: Normal breath sounds.  Musculoskeletal:        General: Normal range of motion.  Skin:    General: Skin is warm and dry.  Neurological:     General: No focal deficit present.     Mental Status: He is alert and oriented to person, place, and time.  Psychiatric:        Mood and Affect: Mood normal.        Behavior: Behavior normal.        Thought Content: Thought content normal.        Judgment: Judgment normal.      Results for orders placed or performed in visit on 05/24/24  CBC with Differential/Platelet   Collection Time: 05/24/24 11:10 AM  Result Value Ref Range   WBC 5.5 3.8 - 10.8 Thousand/uL   RBC 5.16 4.20 - 5.80 Million/uL   Hemoglobin 16.5 13.2 - 17.1 g/dL   HCT 52.2 61.4 - 49.9 %   MCV 92.4 80.0 - 100.0 fL   MCH 32.0 27.0 - 33.0 pg   MCHC 34.6 32.0 - 36.0 g/dL   RDW 87.6 88.9 - 84.9 %   Platelets 192 140 - 400 Thousand/uL   MPV 11.2 7.5 - 12.5 fL   Neutro Abs 3,289 1,500 - 7,800 cells/uL   Absolute Lymphocytes 1,672 850 - 3,900 cells/uL   Absolute Monocytes  413 200 - 950 cells/uL   Eosinophils Absolute 99 15 - 500 cells/uL   Basophils Absolute 28 0 - 200 cells/uL   Neutrophils Relative % 59.8 %   Total Lymphocyte 30.4 %   Monocytes Relative 7.5 %   Eosinophils Relative 1.8 %   Basophils Relative 0.5 %  Comprehensive metabolic panel with GFR   Collection Time: 05/24/24 11:10 AM  Result Value Ref Range   Glucose, Bld 89 65 - 99 mg/dL   BUN 11 7 - 25 mg/dL   Creat 9.16 9.39 - 8.75 mg/dL   eGFR 874 > OR = 60 fO/fpw/8.26f7   BUN/Creatinine Ratio SEE NOTE: 6 - 22 (calc)   Sodium 139 135 - 146 mmol/L   Potassium 4.3 3.5 - 5.3 mmol/L   Chloride 102 98 - 110 mmol/L   CO2 30 20 - 32 mmol/L   Calcium 9.6 8.6 - 10.3 mg/dL   Total Protein 7.1 6.1 - 8.1 g/dL   Albumin 4.9 3.6 - 5.1 g/dL   Globulin 2.2 1.9 - 3.7 g/dL (calc)   AG Ratio 2.2 1.0 - 2.5 (calc)   Total Bilirubin 5.4 (H) 0.2 - 1.2 mg/dL   Alkaline phosphatase (APISO) 63 36 - 130 U/L   AST 15 10 - 40 U/L   ALT 12 9 - 46 U/L  Lipid panel   Collection Time: 05/24/24 11:10 AM  Result Value Ref Range   Cholesterol 129 <200 mg/dL   HDL 58 > OR = 40 mg/dL   Triglycerides 64 <849 mg/dL   LDL Cholesterol (Calc) 57 mg/dL (calc)   Total CHOL/HDL Ratio 2.2 <5.0 (calc)   Non-HDL Cholesterol (Calc) 71 <869 mg/dL (calc)  Hemoglobin J8r   Collection Time: 05/24/24 11:10 AM  Result Value Ref Range   Hgb A1c MFr Bld 4.7 <5.7 %   Mean Plasma Glucose 88 mg/dL   eAG (mmol/L) 4.9 mmol/L          Assessment & Plan:   Problem List Items Addressed This Visit   None Visit Diagnoses       Allodynia    -  Primary   Labwork orderd   Relevant Orders   Vitamin B12   CBC with Differential/Platelet   Comprehensive  metabolic panel with GFR   Magnesium   Analyzer ANA IFA w/RFLX Titer/Pattern,Systemic Autoimmune Panel 1   Heavy Metals Panel, Blood   C-reactive protein   Sedimentation rate     Tingling of skin       Labwork ordered.   Relevant Orders   Vitamin B12   CBC with  Differential/Platelet   Comprehensive metabolic panel with GFR   Magnesium   Analyzer ANA IFA w/RFLX Titer/Pattern,Systemic Autoimmune Panel 1   Heavy Metals Panel, Blood   C-reactive protein   Sedimentation rate        -Panel of labs ordered -Once labwork returns patient will be notified of results -Please return if new or worsening symptoms        Follow up plan: Return if symptoms worsen or fail to improve.    I have reviewed this encounter including the documentation in this note and/or discussed this patient with the provider, Aislinn Womack, SNP, I am certifying that I agree with the content of this note as supervising/preceptor nurse practitioner.  Mliss Spray, FNP-C [facility] 06/15/2024, 1:54 PM

## 2024-06-16 ENCOUNTER — Other Ambulatory Visit: Payer: Self-pay | Admitting: Nurse Practitioner

## 2024-06-16 DIAGNOSIS — R17 Unspecified jaundice: Secondary | ICD-10-CM

## 2024-06-17 ENCOUNTER — Other Ambulatory Visit: Payer: Self-pay | Admitting: Nurse Practitioner

## 2024-06-17 ENCOUNTER — Ambulatory Visit: Payer: Self-pay | Admitting: Nurse Practitioner

## 2024-06-17 DIAGNOSIS — R17 Unspecified jaundice: Secondary | ICD-10-CM

## 2024-06-21 LAB — ANALYZER(R)ANA IFA WITH REFLEX TITER/PATTRN,SYS AUTOIMM PNL1
Anticardiolipin IgA: <2 [APL'U]/mL
Anticardiolipin IgG: <2 [GPL'U]/mL
Anticardiolipin IgM: <2 [MPL'U]/mL
Beta-2 Glyco 1 IgA: <2 U/mL
Beta-2 Glyco 1 IgM: <2 U/mL
Beta-2 Glyco I IgG: <2 U/mL
C3 Complement: 111 mg/dL (ref 82–185)
C3 Complement: 111 mg/dL (ref 82–185)
C4 Complement: 20 mg/dL (ref 15–53)
Chromatin (Nucleosomal) Antibody: <2 [APL'U]/mL
Cyclic Citrullin Peptide Ab: <16 U
DNA Ab (DS) Crithidia, IFA: 20 mg/dL (ref 15–53)
ENA SM Ab Ser-aCnc: <2 [GPL'U]/mL
MUTATED CITRULLINATED VIMENTIN (MCV) AB: <20 U/mL (ref <20)
Rheumatoid Factor (IgA): <5 U
Rheumatoid Factor (IgG): <5 U
Rheumatoid Factor (IgG): <5 U
Rheumatoid Factor (IgM): <5 U
Rheumatoid Factor (IgM): <5 U
Ribonucleic Protein(ENA) Antibody, IgG: <2 U/mL
SM/RNP: <2 [MPL'U]/mL
SSA (Ro) (ENA) Antibody, IgG: <2 U/mL
SSB (La) (ENA) Antibody, IgG: <2 U/mL
Scleroderma (Scl-70) (ENA) Antibody, IgG: <5 U
Thyroperoxidase Ab SerPl-aCnc: <1 [IU]/mL (ref <9)

## 2024-06-21 LAB — HEAVY METALS PANEL, BLOOD
Arsenic: <3 ug/L (ref <23)
Lead: <1 ug/dL (ref <3.5)
Mercury, B: <5 ug/L (ref <11)

## 2024-06-21 LAB — CBC WITH DIFFERENTIAL/PLATELET
Absolute Lymphocytes: 1377 {cells}/uL (ref 850–3900)
Absolute Monocytes: 302 {cells}/uL (ref 200–950)
Basophils Absolute: 32 {cells}/uL (ref 0–200)
Basophils Relative: 0.7 %
Eosinophils Absolute: 81 {cells}/uL (ref 15–500)
Eosinophils Relative: 1.8 %
HCT: 47.3 % (ref 38.5–50.0)
Hemoglobin: 16.3 g/dL (ref 13.2–17.1)
MCH: 31.7 pg (ref 27.0–33.0)
MCHC: 34.5 g/dL (ref 32.0–36.0)
MCV: 91.8 fL (ref 80.0–100.0)
MPV: 11.4 fL (ref 7.5–12.5)
Monocytes Relative: 6.7 %
Neutro Abs: 2709 {cells}/uL (ref 1500–7800)
Neutrophils Relative %: 60.2 %
Platelets: 211 Thousand/uL (ref 140–400)
RBC: 5.15 Million/uL (ref 4.20–5.80)
RDW: 12.5 % (ref 11.0–15.0)
Total Lymphocyte: 30.6 %
WBC: 4.5 Thousand/uL (ref 3.8–10.8)

## 2024-06-21 LAB — COMPREHENSIVE METABOLIC PANEL WITH GFR
AG Ratio: 2.2 (calc) (ref 1.0–2.5)
ALT: 12 U/L (ref 9–46)
AST: 16 U/L (ref 10–40)
Albumin: 5.1 g/dL (ref 3.6–5.1)
Alkaline phosphatase (APISO): 74 U/L (ref 36–130)
BUN: 16 mg/dL (ref 7–25)
CO2: 28 mmol/L (ref 20–32)
Calcium: 9.8 mg/dL (ref 8.6–10.3)
Chloride: 102 mmol/L (ref 98–110)
Creat: 0.86 mg/dL (ref 0.60–1.24)
Globulin: 2.3 g/dL (ref 1.9–3.7)
Glucose, Bld: 77 mg/dL (ref 65–99)
Potassium: 4.5 mmol/L (ref 3.5–5.3)
Sodium: 139 mmol/L (ref 135–146)
Total Bilirubin: 7.1 mg/dL — ABNORMAL HIGH (ref 0.2–1.2)
Total Protein: 7.4 g/dL (ref 6.1–8.1)
eGFR: 124 mL/min/1.73m2 (ref >=60)

## 2024-06-21 LAB — BILIRUBIN, FRACTIONATED(TOT/DIR/INDIR)
Bilirubin, Direct: 0.8 mg/dL — ABNORMAL HIGH (ref 0.0–0.2)
Indirect Bilirubin: 6.3 mg/dL — ABNORMAL HIGH (ref 0.2–1.2)
Total Bilirubin: 7.1 mg/dL — ABNORMAL HIGH (ref 0.2–1.2)

## 2024-06-21 LAB — VITAMIN B12: Vitamin B-12: 279 pg/mL (ref 200–1100)

## 2024-06-21 LAB — C-REACTIVE PROTEIN: CRP: <3 mg/L (ref <8.0)

## 2024-06-21 LAB — TEST AUTHORIZATION

## 2024-06-21 LAB — MAGNESIUM: Magnesium: 1.9 mg/dL (ref 1.5–2.5)

## 2024-06-21 LAB — SEDIMENTATION RATE: Sed Rate: 2 mm/h (ref 0–15)

## 2024-06-24 ENCOUNTER — Ambulatory Visit (HOSPITAL_COMMUNITY)
Admission: RE | Admit: 2024-06-24 | Discharge: 2024-06-24 | Disposition: A | Discharge status: Home / Self Care | Source: Ambulatory Visit | Attending: Nurse Practitioner | Admitting: Nurse Practitioner

## 2024-06-24 DIAGNOSIS — R17 Unspecified jaundice: Secondary | ICD-10-CM | POA: Diagnosis present

## 2024-07-04 ENCOUNTER — Ambulatory Visit: Payer: Self-pay | Admitting: Nurse Practitioner

## 2024-07-04 DIAGNOSIS — R17 Unspecified jaundice: Secondary | ICD-10-CM

## 2024-08-29 NOTE — Progress Notes (Unsigned)
 08/30/2024 Kevin Miranda [id number] 09/19/2001  Gastroenterology Office Note    Referring Provider: Gareth Mliss FALCON, FNP Primary Care Physician:  Gareth Mliss FALCON, FNP  Primary GI Provider: Jinny Carmine, MD    Chief Complaint   Chief Complaint  Patient presents with   New Patient (Initial Visit)    1 month ago he had feeling of static  electricity-which led to seeing his PCP-jaundice is resolved-     History of Present Illness   Kevin Miranda is a 23 y.o. male presenting today at the request of Gareth Mliss FALCON, FNP due to elevated bilirubin.   Patient seen by PCP on 06/15/2024 with complaints of intermittent pins-and-needles sensation to his skin. Labs revealed hyperbilirubinemia.   Patient reports that for several months prior to his PCP appointment that he was noticing that his eyes and skin were jaundiced but has since resolved.  He reports he did have a mild cold in the last few months, but no other sickness.  Denies any major stress currently.  He has a good appetite.  Denies alcohol use.  No new medications or OTC supplements. Patient reports he was taking Accutane for about 6 months in 2024 and had stopped by Dec 2024.   06/15/2024 Total Bilirubin 7.1 High   Bilirubin, Direct 0.8 High   Indirect Bilirubin 6.3 High    05/24/2024 total bilirubin 5.4.  06/24/2024 US  Abd complete: no significant abnormality.     Past Medical History:  Diagnosis Date   Acne    Closed fracture of distal end of left radius with routine healing     Past Surgical History:  Procedure Laterality Date   CIRCUMCISION      No current outpatient medications on file.   No current facility-administered medications for this visit.    Allergies as of 08/30/2024 - Review Complete 08/30/2024  Allergen Reaction Noted   Penicillins Rash 02/08/2016    Family History  Problem Relation Age of Onset   Depression Mother    Asthma Father    Migraines Father    Allergic rhinitis  Brother     Social History   Socioeconomic History   Marital status: Single    Spouse name: Not on file   Number of children: Not on file   Years of education: Not on file   Highest education level: Not on file  Occupational History   Not on file  Tobacco Use   Smoking status: Never    Passive exposure: Never   Smokeless tobacco: Never  Vaping Use   Vaping status: Never Used  Substance and Sexual Activity   Alcohol use: No    Alcohol/week: 0.0 standard drinks of alcohol   Drug use: No   Sexual activity: Not Currently  Other Topics Concern   Not on file  Social History Narrative   Not on file   Social Drivers of Health   Financial Resource Strain: Low Risk  (05/24/2024)   Overall Financial Resource Strain (CARDIA)    Difficulty of Paying Living Expenses: Not hard at all  Food Insecurity: No Food Insecurity (05/24/2024)   Hunger Vital Sign    Worried About Running Out of Food in the Last Year: Never true    Ran Out of Food in the Last Year: Never true  Transportation Needs: No Transportation Needs (05/24/2024)   PRAPARE - Administrator, Civil Service (Medical): No    Lack of Transportation (Non-Medical): No  Physical Activity: Inactive (05/24/2024)  Exercise Vital Sign    Days of Exercise per Week: 0 days    Minutes of Exercise per Session: 0 min  Stress: No Stress Concern Present (05/24/2024)   [employer] of Occupational Health - Occupational Stress Questionnaire    Feeling of Stress: Not at all  Social Connections: Socially Isolated (05/24/2024)   Social Connection and Isolation Panel    Frequency of Communication with Friends and Family: Twice a week    Frequency of Social Gatherings with Friends and Family: Once a week    Attends Religious Services: Never    Database Administrator or Organizations: No    Attends Banker Meetings: Never    Marital Status: Never married  Intimate Partner Violence: Not At Risk (05/24/2024)    Humiliation, Afraid, Rape, and Kick questionnaire    Fear of Current or Ex-Partner: No    Emotionally Abused: No    Physically Abused: No    Sexually Abused: No     RELEVANT GI HISTORY, IMAGING AND LABS: CBC    Component Value Date/Time   WBC 4.5 06/15/2024 1336   RBC 5.15 06/15/2024 1336   HGB 16.3 06/15/2024 1336   HCT 47.3 06/15/2024 1336   PLT 211 06/15/2024 1336   MCV 91.8 06/15/2024 1336   MCH 31.7 06/15/2024 1336   MCHC 34.5 06/15/2024 1336   RDW 12.5 06/15/2024 1336   LYMPHSABS 1,509 10/28/2022 1556   EOSABS 81 06/15/2024 1336   BASOSABS 32 06/15/2024 1336   Recent Labs    05/24/24 1110 06/15/24 1336  HGB 16.5 16.3    CMP     Component Value Date/Time   NA 139 06/15/2024 1336   K 4.5 06/15/2024 1336   CL 102 06/15/2024 1336   CO2 28 06/15/2024 1336   GLUCOSE 77 06/15/2024 1336   BUN 16 06/15/2024 1336   CREATININE 0.86 06/15/2024 1336   CALCIUM 9.8 06/15/2024 1336   PROT 7.4 06/15/2024 1336   AST 16 06/15/2024 1336   ALT 12 06/15/2024 1336   BILITOT 7.1 (H) 06/15/2024 1336   BILITOT 7.1 (H) 06/15/2024 1336      Latest Ref Rng & Units 06/15/2024    1:36 PM 05/24/2024   11:10 AM 10/28/2022    3:56 PM  Hepatic Function  Total Protein 6.1 - 8.1 g/dL 7.4  7.1  7.4   AST 10 - 40 U/L 16  15  15    ALT 9 - 46 U/L 12  12  12    Total Bilirubin 0.2 - 1.2 mg/dL 0.2 - 1.2 mg/dL 7.1    7.1  5.4  3.0   Bilirubin, Direct 0.0 - 0.2 mg/dL 0.8         Review of Systems   All systems reviewed and negative except where noted in HPI.    Physical Exam  BP 111/74   Pulse 76   Temp 97.7 F (36.5 C)   Ht 5' 9.5 (1.765 m)   Wt 147 lb 12.8 oz (67 kg)   SpO2 98%   BMI 21.51 kg/m  No LMP for male patient. General:   Alert and oriented. Pleasant and cooperative. Well-nourished and well-developed. In no acute distress.  Head:  Normocephalic and atraumatic. Eyes:  Anicteric Ears:  Normal auditory acuity. Neck:  Supple; no masses or thyromegaly. Lungs:   Respirations even and unlabored.  Clear throughout to auscultation.   No wheezes, crackles, or rhonchi. No acute distress. Heart:  Regular rate and rhythm; no murmurs, clicks, rubs,  or gallops. Abdomen:  Normal bowel sounds.  No bruits.  Soft, non-tender and non-distended without masses, hepatosplenomegaly or hernias noted.  No guarding or rebound tenderness.    Rectal:  Deferred. Msk:  Symmetrical without gross deformities. Normal posture. Extremities:  Without edema. Neurologic:  Alert and  oriented x4;  grossly normal neurologically. Skin:  Intact without significant lesions or rashes. No jaundice observed. Psych:  Alert and cooperative. Normal mood and affect.   Assessment & Plan   Kevin Miranda is a 23 y.o. male presenting today with elevated bilirubin.   Elevated bilirubin.  Markedly elevated indirect bilirubin.  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 Ultrasound negative. Other LFTs WNL.  - Will repeat repeat labs: bilirubin, fractionated, CBC, and hepatic function.   - Hemolysis unlikely due to normal hemoglobin but will check reticulocyte count, LDH, and haptoglobin.   Follow up in 3 months.   Grayce Bohr, DNP, AGNP-C [facility]

## 2024-08-30 ENCOUNTER — Encounter: Payer: Self-pay | Admitting: Family Medicine

## 2024-08-30 ENCOUNTER — Ambulatory Visit (INDEPENDENT_AMBULATORY_CARE_PROVIDER_SITE_OTHER): Admitting: Family Medicine

## 2024-08-30 VITALS — BP 111/74 | HR 76 | Temp 97.7°F | Ht 69.5 in | Wt 147.8 lb

## 2024-08-30 DIAGNOSIS — R17 Unspecified jaundice: Secondary | ICD-10-CM

## 2024-09-07 ENCOUNTER — Ambulatory Visit: Payer: Self-pay | Admitting: Family Medicine

## 2024-09-07 LAB — CBC WITH DIFFERENTIAL/PLATELET
Basophils Absolute: 0 x10E3/uL (ref 0.0–0.2)
Basos: 1 %
EOS (ABSOLUTE): 0.2 x10E3/uL (ref 0.0–0.4)
Eos: 4 %
Hematocrit: 48.2 % (ref 37.5–51.0)
Hemoglobin: 16.3 g/dL (ref 13.0–17.7)
Immature Grans (Abs): 0 x10E3/uL (ref 0.0–0.1)
Immature Granulocytes: 0 %
Lymphocytes Absolute: 1.5 x10E3/uL (ref 0.7–3.1)
Lymphs: 27 %
MCH: 32.1 pg (ref 26.6–33.0)
MCHC: 33.8 g/dL (ref 31.5–35.7)
MCV: 95 fL (ref 79–97)
Monocytes Absolute: 0.4 x10E3/uL (ref 0.1–0.9)
Monocytes: 7 %
Neutrophils Absolute: 3.4 x10E3/uL (ref 1.4–7.0)
Neutrophils: 61 %
Platelets: 200 x10E3/uL (ref 150–450)
RBC: 5.07 x10E6/uL (ref 4.14–5.80)
RDW: 12.7 % (ref 11.6–15.4)
WBC: 5.6 x10E3/uL (ref 3.4–10.8)

## 2024-09-07 LAB — BILIRUBIN, FRACTIONATED(TOT/DIR/INDIR)
Bilirubin Total: 2.6 mg/dL — ABNORMAL HIGH (ref 0.0–1.2)
Bilirubin, Direct: 0.6 mg/dL — ABNORMAL HIGH (ref 0.00–0.40)
Bilirubin, Indirect: 2 mg/dL — ABNORMAL HIGH (ref 0.10–0.80)

## 2024-09-07 LAB — HAPTOGLOBIN: Haptoglobin: 72 mg/dL (ref 17–317)

## 2024-09-07 LAB — HEPATIC FUNCTION PANEL
ALT: 12 IU/L (ref 0–44)
AST: 17 IU/L (ref 0–40)
Albumin: 4.7 g/dL (ref 4.3–5.2)
Alkaline Phosphatase: 75 IU/L (ref 47–123)
Bilirubin Total: 2.7 mg/dL — ABNORMAL HIGH (ref 0.0–1.2)
Bilirubin, Direct: 0.67 mg/dL — ABNORMAL HIGH (ref 0.00–0.40)
Total Protein: 6.8 g/dL (ref 6.0–8.5)

## 2024-09-07 LAB — RETICULOCYTES: Retic Ct Pct: 2 % (ref 0.6–2.6)

## 2024-09-07 LAB — LACTATE DEHYDROGENASE: LDH: 166 IU/L (ref 121–224)

## 2024-09-13 NOTE — Telephone Encounter (Signed)
 Tried calling patient-mailbox full-unable to leave message-Edwards, Grayce, NP  Trudy Leeanna RAMAN, CMA   Hi, please call patient and let him know his lab results. He has not viewed the message in Mychart.  Thanks  Grayce Bohr, DNP, AGNP-C

## 2024-11-30 ENCOUNTER — Ambulatory Visit: Admitting: Family Medicine
